# Patient Record
Sex: Female | Born: 2000 | Race: Black or African American | Hispanic: No | Marital: Single | Smoking: Never smoker
Health system: Southern US, Community
[De-identification: ages and names within clinical notes are randomized; demographics above are authoritative.]

---

## 2021-10-09 ENCOUNTER — Other Ambulatory Visit: Payer: Self-pay

## 2021-10-09 ENCOUNTER — Encounter (HOSPITAL_COMMUNITY): Payer: Self-pay | Admitting: Emergency Medicine

## 2021-10-09 ENCOUNTER — Emergency Department (HOSPITAL_COMMUNITY)
Admission: EM | Admit: 2021-10-09 | Discharge: 2021-10-10 | Disposition: A | Discharge status: Home / Self Care | Payer: Medicaid Other | Attending: Emergency Medicine | Admitting: Emergency Medicine

## 2021-10-09 DIAGNOSIS — R2242 Localized swelling, mass and lump, left lower limb: Secondary | ICD-10-CM | POA: Diagnosis not present

## 2021-10-09 DIAGNOSIS — Y92009 Unspecified place in unspecified non-institutional (private) residence as the place of occurrence of the external cause: Secondary | ICD-10-CM | POA: Insufficient documentation

## 2021-10-09 DIAGNOSIS — M25562 Pain in left knee: Secondary | ICD-10-CM | POA: Diagnosis not present

## 2021-10-09 DIAGNOSIS — Z5321 Procedure and treatment not carried out due to patient leaving prior to being seen by health care provider: Secondary | ICD-10-CM | POA: Insufficient documentation

## 2021-10-09 DIAGNOSIS — W19XXXA Unspecified fall, initial encounter: Secondary | ICD-10-CM | POA: Diagnosis not present

## 2021-10-09 NOTE — ED Provider Notes (Signed)
Emergency Medicine Provider Triage Evaluation Note  Diane Cabrera , a 20 y.o. female  was evaluated in triage.  Pt complains of left knee pain since yesterday.  She denies any other injuries.  She didn't strike her head.  She doesn't take any blood thinning medications.   Review of Systems  Positive: Left knee pain.  Negative: headinjury  Physical Exam  BP 138/78 (BP Location: Right Arm)   Pulse (!) 118   Temp 98.6 F (37 C) (Oral)   Resp 16   Ht 5\' 7"  (1.702 m)   Wt 105 kg   LMP 09/25/2021   SpO2 100%   BMI 36.26 kg/m  Gen:   Awake, no distress   Resp:  Normal effort  MSK:   Moves extremities without difficulty  Other:  Limp.  Sensation intact to left foot, slightly decreased around hematoma on medial posterior left knee.    Medical Decision Making  Medically screening exam initiated at 11:43 PM.  Appropriate orders placed.  09/27/2021 was informed that the remainder of the evaluation will be completed by another provider, this initial triage assessment does not replace that evaluation, and the importance of remaining in the ED until their evaluation is complete.     Diane Contras, PA-C 10/09/21 2348    10/11/21, MD 10/10/21 [phone]

## 2021-10-09 NOTE — ED Triage Notes (Signed)
Patient fell yesterday at home , denies LOC/ambulatory , reports left knee pain with mild swelling .

## 2021-10-10 ENCOUNTER — Emergency Department (HOSPITAL_COMMUNITY): Payer: Medicaid Other

## 2021-10-10 NOTE — ED Notes (Signed)
Pt upset with wait time, stating she is going to another hospital

## 2022-11-10 IMAGING — CR DG KNEE COMPLETE 4+V*L*
4 series · 4 of 4 positions shown · non-contrast
Comparison: None.

CLINICAL DATA: Initial evaluation for acute pain status post fall.

EXAM:
LEFT KNEE - COMPLETE 4+ VIEW

[knee ap]
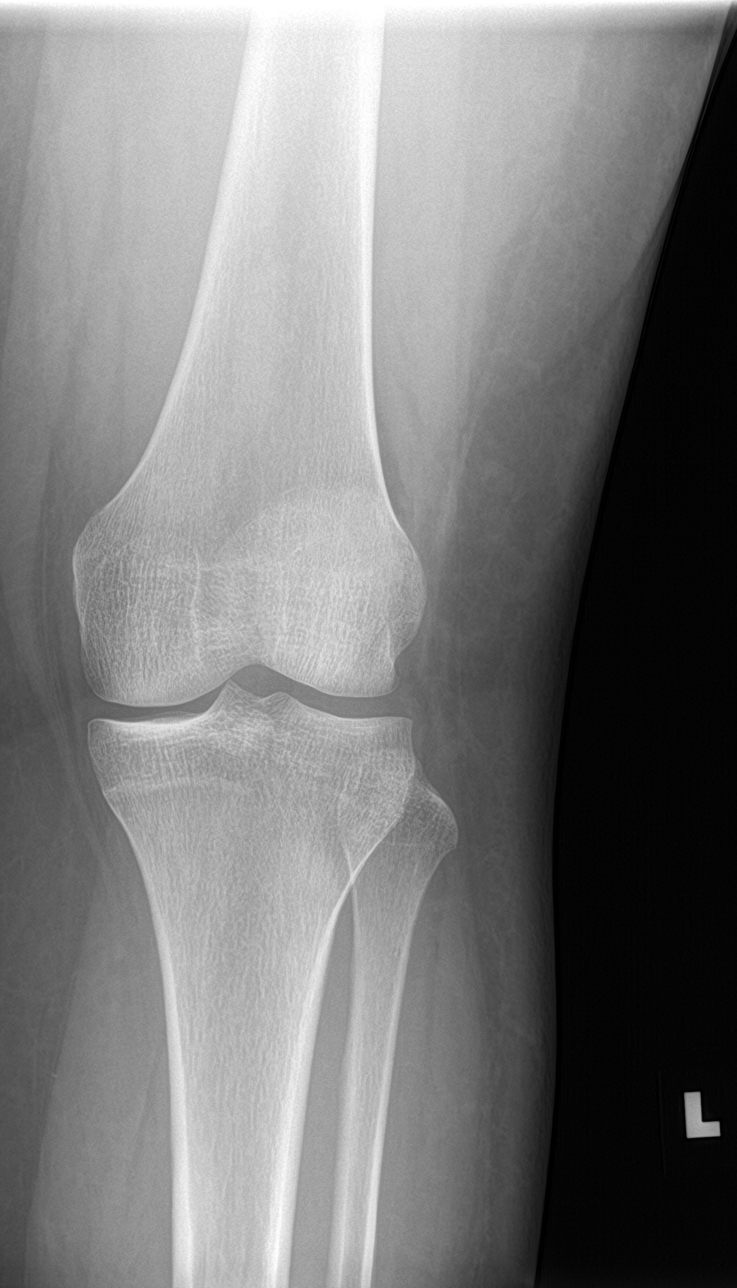

[knee lat]
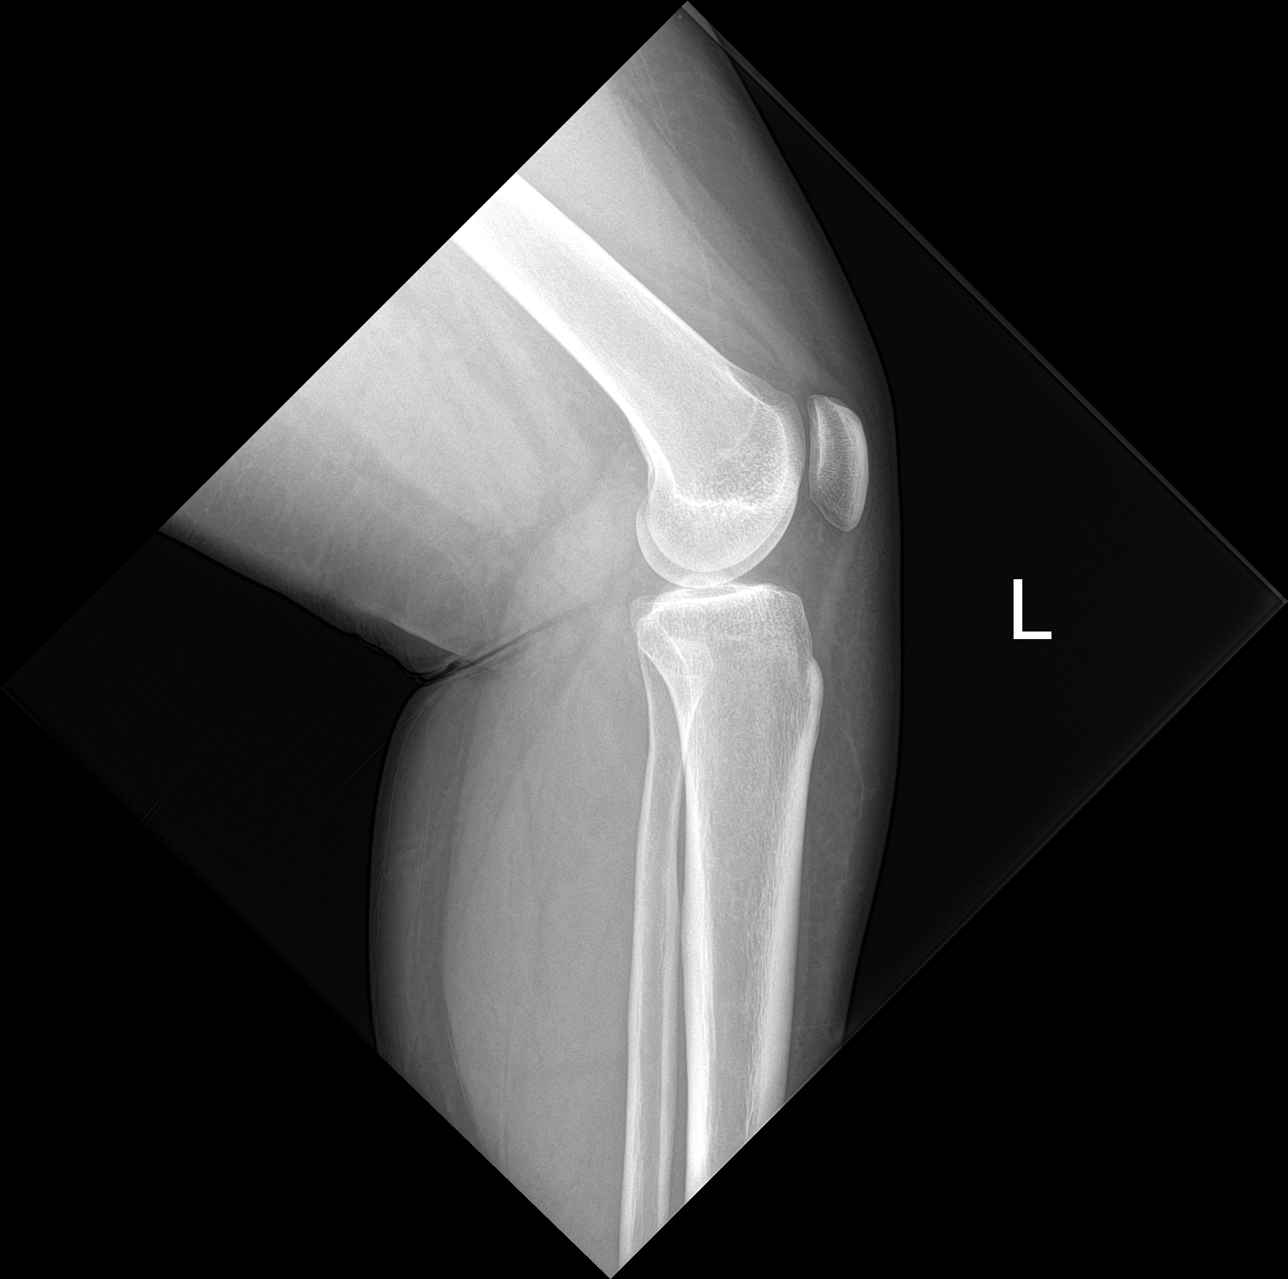

[knee obl (1 of 2)]
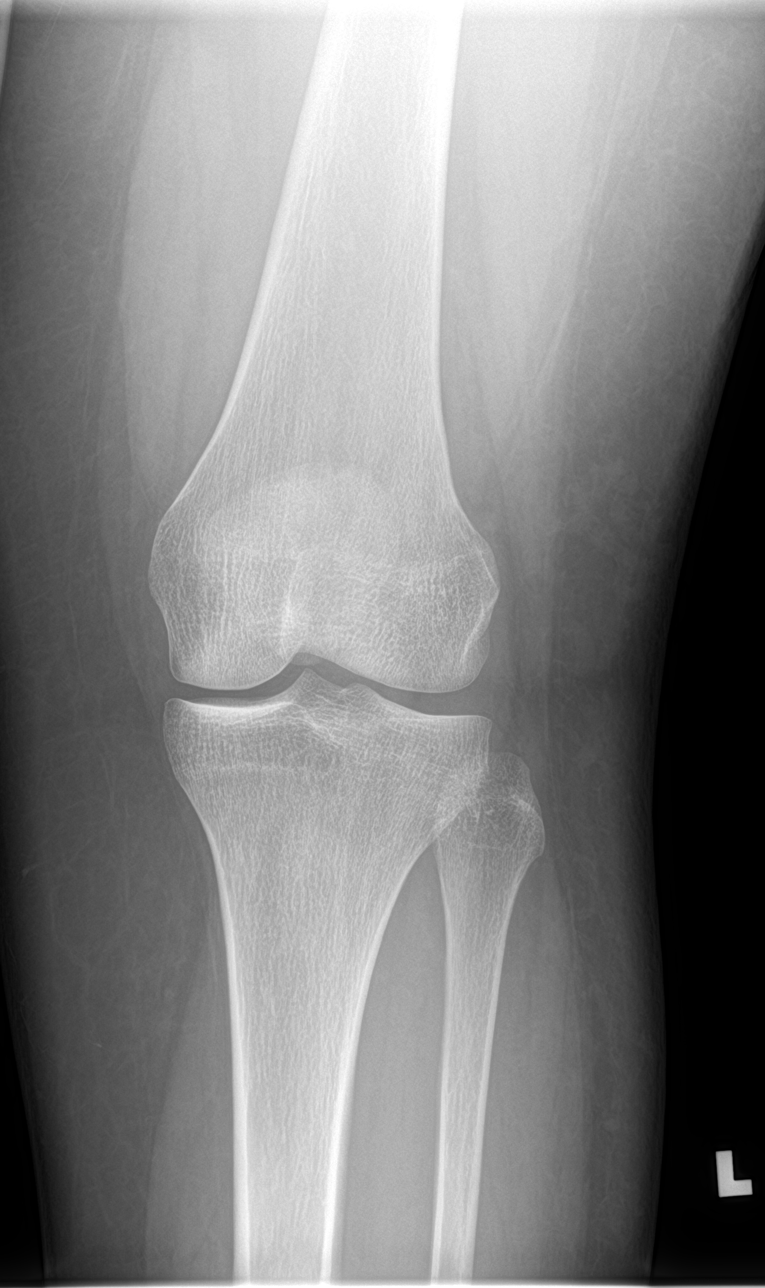

[knee obl (2 of 2)]
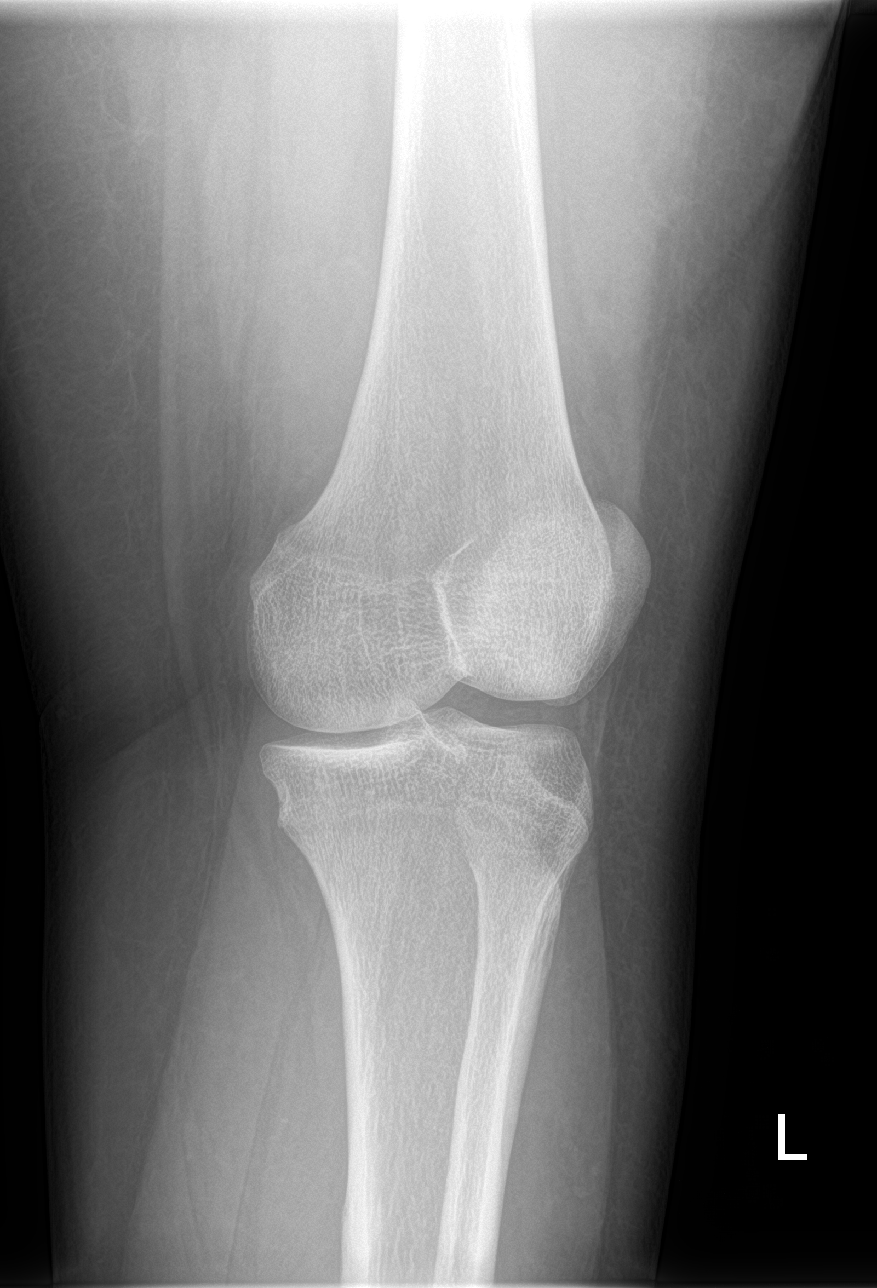

[4 of 4 positions shown; findings below may reference images not displayed]

FINDINGS: No evidence of fracture, dislocation, or joint effusion. No evidence
of arthropathy or other focal bone abnormality. Soft tissues are
unremarkable.
IMPRESSION: Negative.
